# Patient Record
Sex: Male | Born: 1975 | Race: White | Hispanic: No | Marital: Married | Smoking: Never smoker
Health system: Southern US, Community
[De-identification: ages and names within clinical notes are randomized; demographics above are authoritative.]

## PROBLEM LIST (undated history)

## (undated) DIAGNOSIS — N200 Calculus of kidney: Secondary | ICD-10-CM

## (undated) DIAGNOSIS — Z9884 Bariatric surgery status: Secondary | ICD-10-CM

## (undated) DIAGNOSIS — Z9889 Other specified postprocedural states: Secondary | ICD-10-CM

---

## 2003-09-03 ENCOUNTER — Encounter: Admission: RE | Admit: 2003-09-03 | Discharge: 2003-12-02 | Payer: Self-pay | Admitting: Surgery

## 2003-09-07 ENCOUNTER — Ambulatory Visit (HOSPITAL_COMMUNITY): Admission: RE | Admit: 2003-09-07 | Discharge: 2003-09-07 | Payer: Self-pay | Admitting: Surgery

## 2003-09-11 ENCOUNTER — Ambulatory Visit (HOSPITAL_BASED_OUTPATIENT_CLINIC_OR_DEPARTMENT_OTHER): Admission: RE | Admit: 2003-09-11 | Discharge: 2003-09-11 | Payer: Self-pay | Admitting: Surgery

## 2003-11-10 ENCOUNTER — Inpatient Hospital Stay (HOSPITAL_COMMUNITY): Admission: RE | Admit: 2003-11-10 | Discharge: 2003-11-12 | Payer: Self-pay | Admitting: Surgery

## 2003-12-11 ENCOUNTER — Encounter: Admission: RE | Admit: 2003-12-11 | Discharge: 2004-03-10 | Payer: Self-pay | Admitting: Surgery

## 2004-05-13 ENCOUNTER — Encounter: Admission: RE | Admit: 2004-05-13 | Discharge: 2004-08-11 | Payer: Self-pay | Admitting: Surgery

## 2004-09-16 ENCOUNTER — Encounter: Admission: RE | Admit: 2004-09-16 | Discharge: 2004-12-15 | Payer: Self-pay | Admitting: Surgery

## 2004-11-29 ENCOUNTER — Encounter: Admission: RE | Admit: 2004-11-29 | Discharge: 2004-11-29 | Payer: Self-pay | Admitting: Surgery

## 2005-01-18 ENCOUNTER — Observation Stay (HOSPITAL_COMMUNITY): Admission: RE | Admit: 2005-01-18 | Discharge: 2005-01-19 | Payer: Self-pay | Admitting: Surgery

## 2005-12-27 IMAGING — CT CT ABDOMEN W/ CM
1 of 2 series · 15 of 32 positions shown, 19 images · IV contrast (GASTROGRAFIN & [ID] OMNI 300)
Comparison: none

CLINICAL DATA: Status post gastric bypass one year ago.  Now with supraumbilical pain.  Question hernia.
TECHNIQUE: Contiguous 5 mm axial images of the abdomen and pelvis were obtained after administration of oral and 100 cc of nonionic intravenous contrast. 
CT ABDOMEN WITH CONTRAST: 
No focal abnormality is seen in the liver or spleen.  Fluid is visible within the distal stomach and duodenum.  Pancreas, gallbladder, adrenal glands, and kidneys are unremarkable.  No free fluid or lymphadenopathy.
Approximately 5 cm cranial to the umbilicus, in the midline, is an 11 mm fascial defect which appears to only contain fat although there is fluid in the hernia sac.  No evidence for bowel obstruction.

[Series 2: a&p w/ · axial · 0.74mm/px · z∈[-434,-34]mm · 15 of 90 slices shown, 19 images]
[im 5/90  soft-tissue]
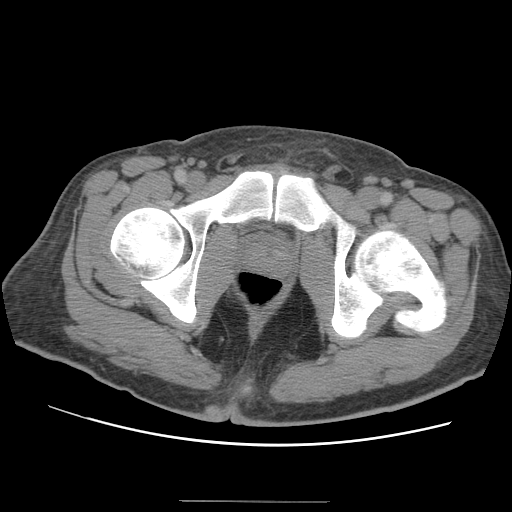
[im 5/90  bone]
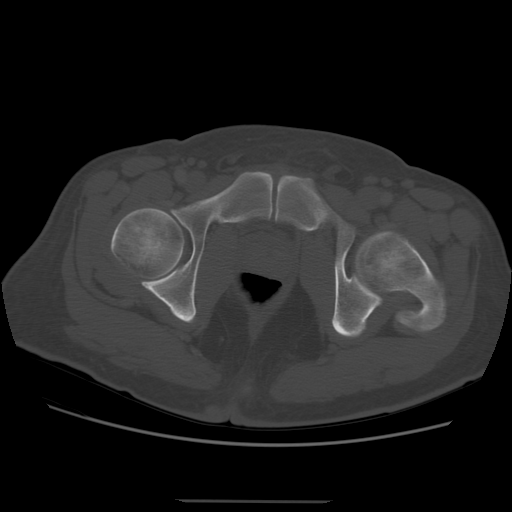
[im 13/90  soft-tissue]
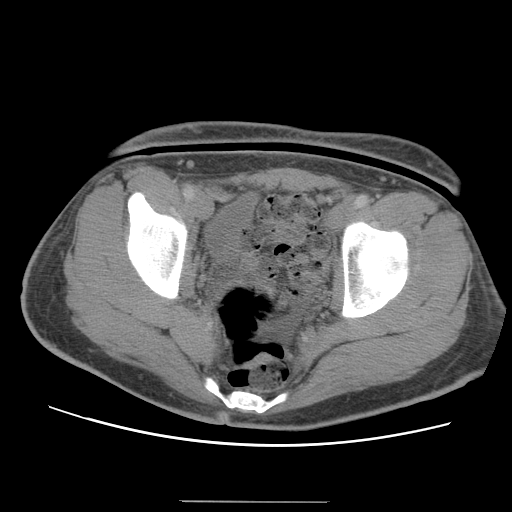
[im 21/90  soft-tissue]
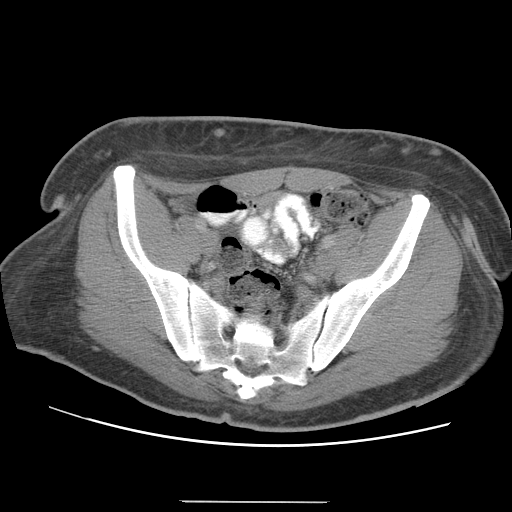
[im 25/90  soft-tissue]
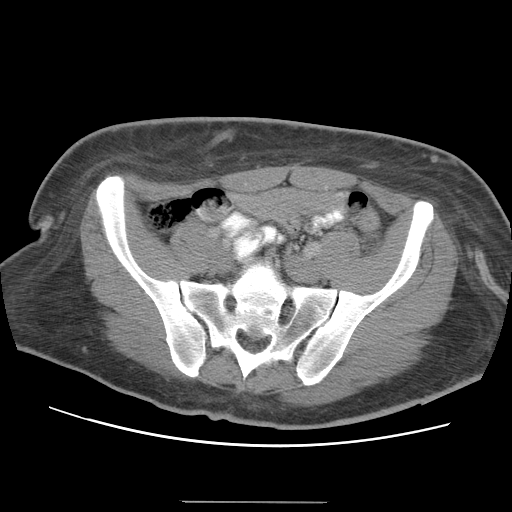
[im 33/90  soft-tissue]
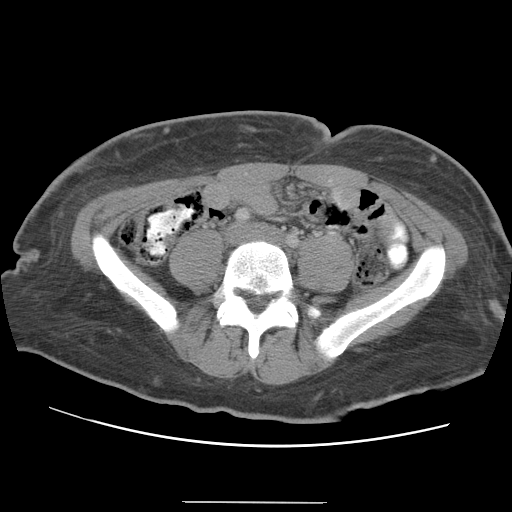
[im 37/90  soft-tissue]
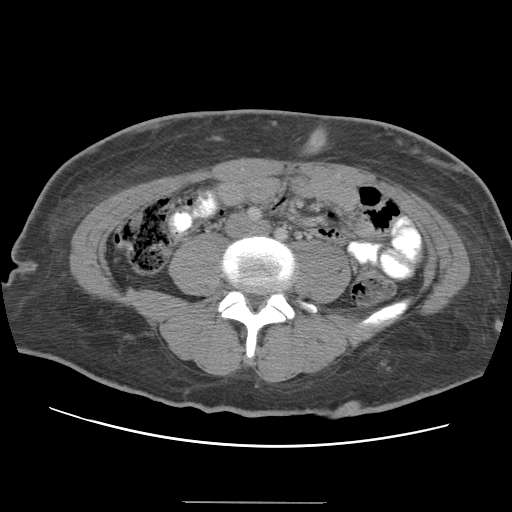
[im 45/90  soft-tissue]
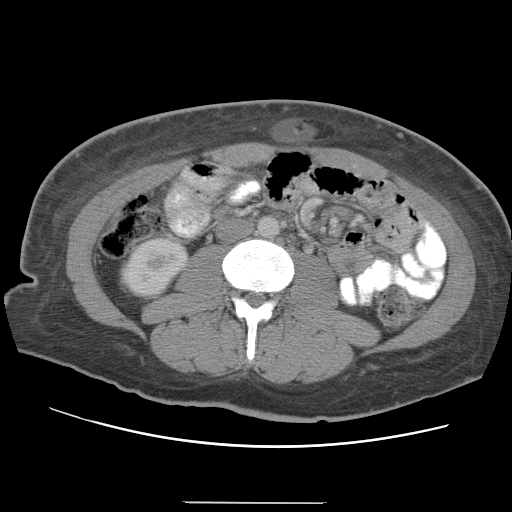
[im 53/90  soft-tissue]
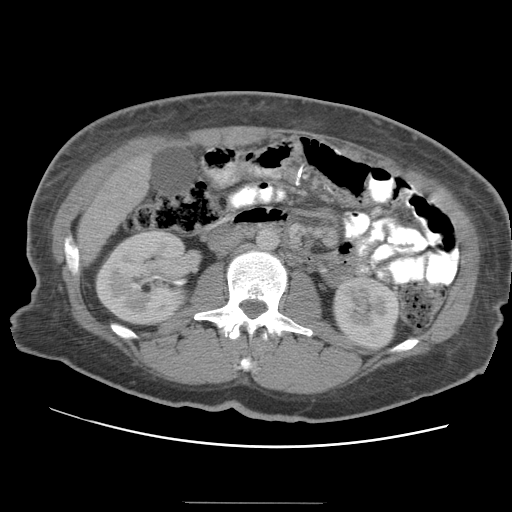
[im 57/90  soft-tissue]
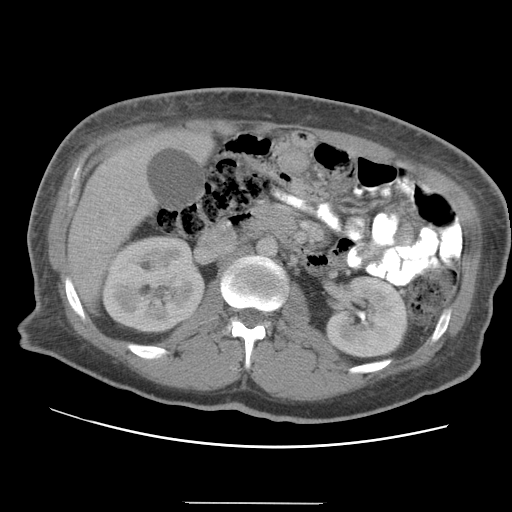
[im 57/90  bone]
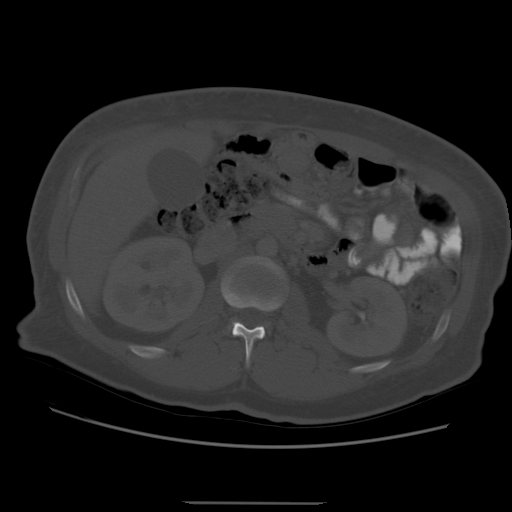
[im 65/90  soft-tissue]
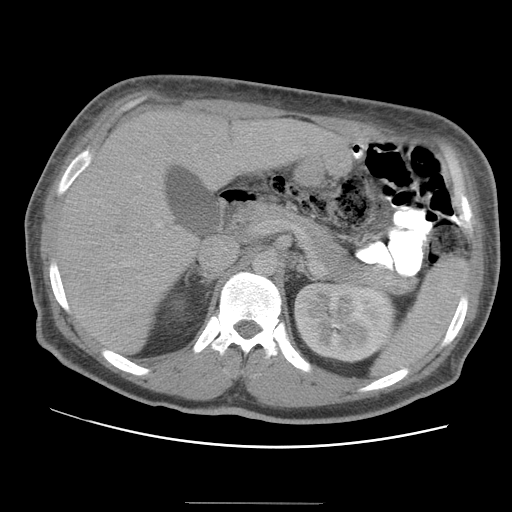
[im 69/90  soft-tissue]
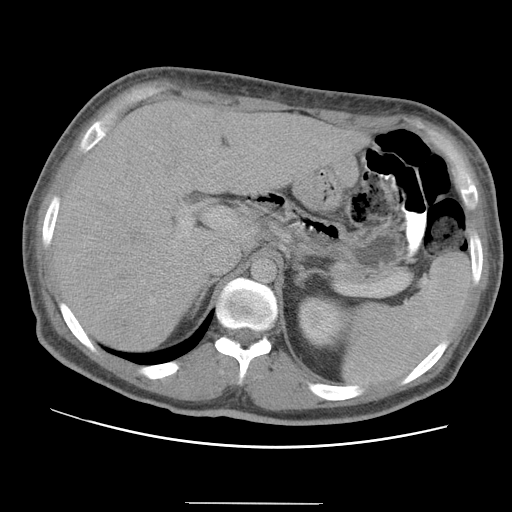
[im 73/90  lung]
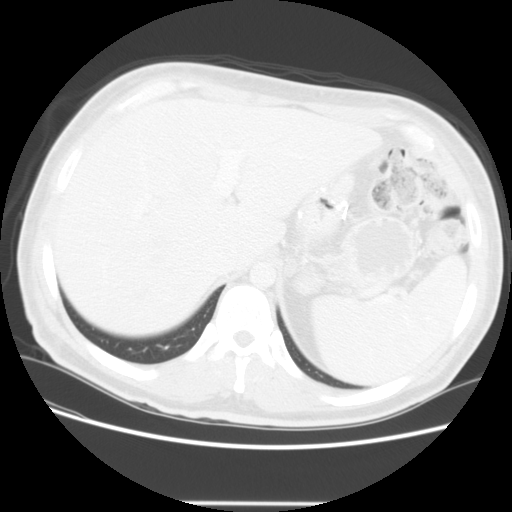
[im 77/90  soft-tissue]
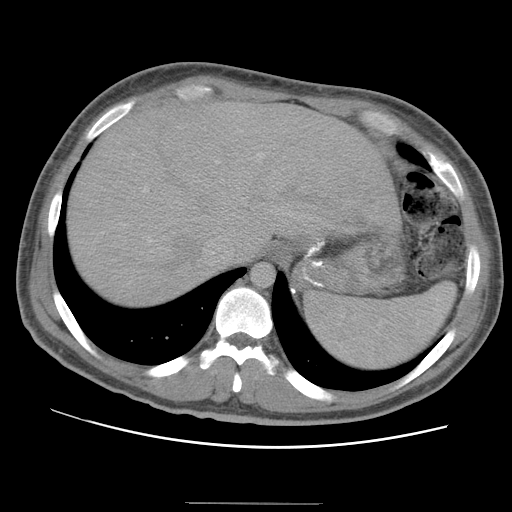
[im 77/90  lung]
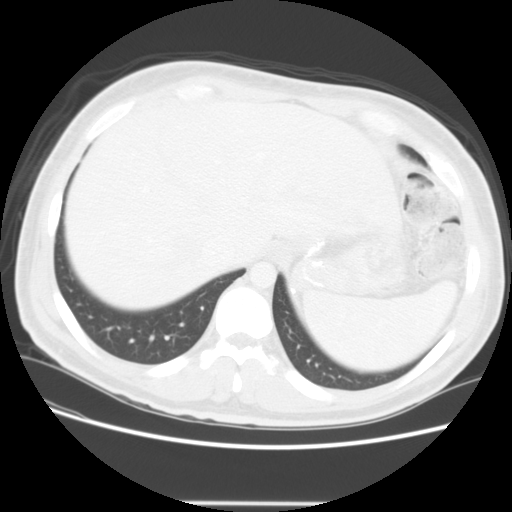
[im 81/90  lung]
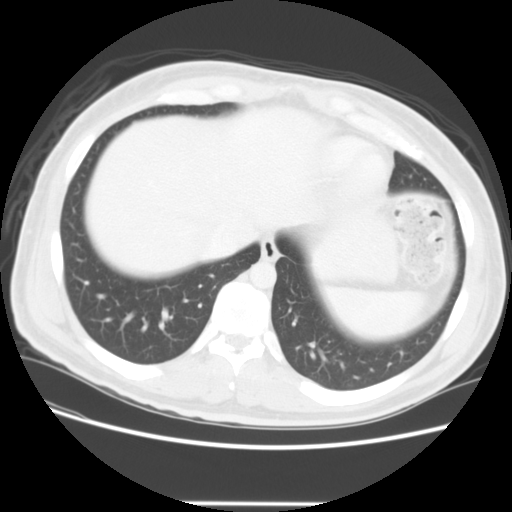
[im 85/90  soft-tissue]
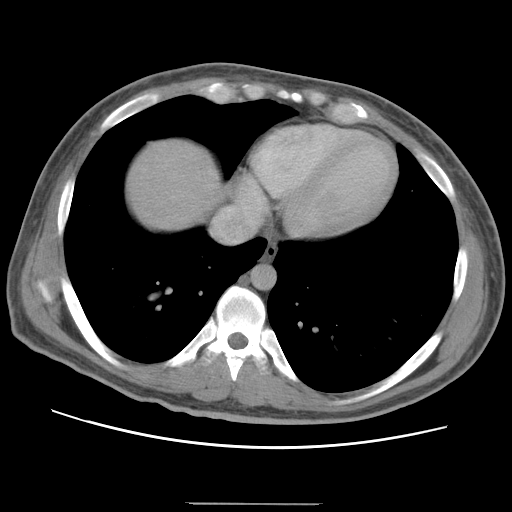
[im 85/90  lung]
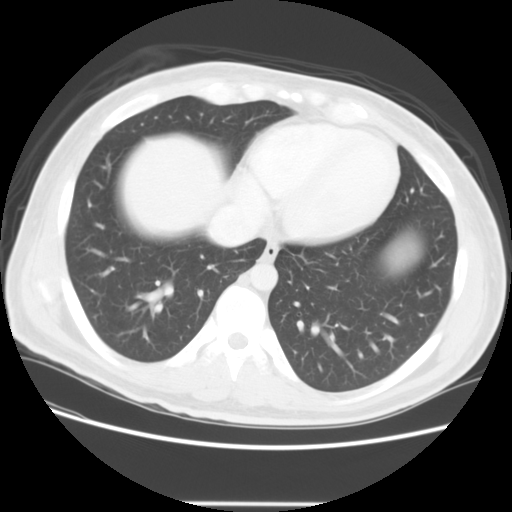

[15 of 32 positions shown; findings below may reference images not displayed]

IMPRESSION: 1.  Small midline fascial defect contains herniated fat and has associated fluid within the hernia sac.   Given the associated fluid, incarceration of the herniated fat is a concern.  
2.  Status post gastric bypass with fluid visible in the distal stomach. 

CT PELVIS WITH CONTRAST:
Trace amount of intraperitoneal free fluid is apparent.  No evidence for lymphadenopathy.  The terminal ileum has normal features.  The appendix is not well seen.
IMPRESSION: Small amount of free intraperitoneal fluid in the anatomic pelvis.

## 2007-09-05 DIAGNOSIS — Z9889 Other specified postprocedural states: Secondary | ICD-10-CM

## 2007-09-05 DIAGNOSIS — Z9884 Bariatric surgery status: Secondary | ICD-10-CM

## 2007-09-05 HISTORY — DX: Other specified postprocedural states: Z98.890

## 2007-09-05 HISTORY — DX: Bariatric surgery status: Z98.84

## 2018-02-08 DIAGNOSIS — S2239XA Fracture of one rib, unspecified side, initial encounter for closed fracture: Secondary | ICD-10-CM | POA: Diagnosis not present

## 2018-02-08 DIAGNOSIS — S22009A Unspecified fracture of unspecified thoracic vertebra, initial encounter for closed fracture: Secondary | ICD-10-CM | POA: Diagnosis not present

## 2018-02-09 ENCOUNTER — Other Ambulatory Visit: Payer: Self-pay

## 2018-02-09 ENCOUNTER — Observation Stay (HOSPITAL_COMMUNITY)
Admission: AD | Admit: 2018-02-09 | Discharge: 2018-02-11 | Disposition: A | Discharge status: Home / Self Care | Payer: No Typology Code available for payment source | Source: Other Acute Inpatient Hospital | Attending: Family Medicine | Admitting: Family Medicine

## 2018-02-09 ENCOUNTER — Encounter (HOSPITAL_COMMUNITY): Payer: Self-pay

## 2018-02-09 DIAGNOSIS — M5441 Lumbago with sciatica, right side: Secondary | ICD-10-CM | POA: Insufficient documentation

## 2018-02-09 DIAGNOSIS — R030 Elevated blood-pressure reading, without diagnosis of hypertension: Secondary | ICD-10-CM

## 2018-02-09 DIAGNOSIS — G8929 Other chronic pain: Secondary | ICD-10-CM | POA: Insufficient documentation

## 2018-02-09 DIAGNOSIS — K029 Dental caries, unspecified: Secondary | ICD-10-CM | POA: Diagnosis not present

## 2018-02-09 DIAGNOSIS — S2239XA Fracture of one rib, unspecified side, initial encounter for closed fracture: Secondary | ICD-10-CM | POA: Diagnosis not present

## 2018-02-09 DIAGNOSIS — Z9884 Bariatric surgery status: Secondary | ICD-10-CM | POA: Insufficient documentation

## 2018-02-09 DIAGNOSIS — S22029A Unspecified fracture of second thoracic vertebra, initial encounter for closed fracture: Secondary | ICD-10-CM | POA: Insufficient documentation

## 2018-02-09 DIAGNOSIS — S2232XA Fracture of one rib, left side, initial encounter for closed fracture: Secondary | ICD-10-CM | POA: Insufficient documentation

## 2018-02-09 DIAGNOSIS — Z87442 Personal history of urinary calculi: Secondary | ICD-10-CM | POA: Insufficient documentation

## 2018-02-09 DIAGNOSIS — S22019A Unspecified fracture of first thoracic vertebra, initial encounter for closed fracture: Principal | ICD-10-CM | POA: Insufficient documentation

## 2018-02-09 DIAGNOSIS — Z79899 Other long term (current) drug therapy: Secondary | ICD-10-CM | POA: Insufficient documentation

## 2018-02-09 DIAGNOSIS — S22009A Unspecified fracture of unspecified thoracic vertebra, initial encounter for closed fracture: Secondary | ICD-10-CM

## 2018-02-09 DIAGNOSIS — Z809 Family history of malignant neoplasm, unspecified: Secondary | ICD-10-CM | POA: Insufficient documentation

## 2018-02-09 DIAGNOSIS — M546 Pain in thoracic spine: Secondary | ICD-10-CM

## 2018-02-09 DIAGNOSIS — S2231XA Fracture of one rib, right side, initial encounter for closed fracture: Secondary | ICD-10-CM | POA: Insufficient documentation

## 2018-02-09 DIAGNOSIS — S0990XA Unspecified injury of head, initial encounter: Secondary | ICD-10-CM | POA: Insufficient documentation

## 2018-02-09 DIAGNOSIS — K439 Ventral hernia without obstruction or gangrene: Secondary | ICD-10-CM | POA: Insufficient documentation

## 2018-02-09 DIAGNOSIS — F1120 Opioid dependence, uncomplicated: Secondary | ICD-10-CM | POA: Diagnosis not present

## 2018-02-09 DIAGNOSIS — M549 Dorsalgia, unspecified: Secondary | ICD-10-CM | POA: Diagnosis present

## 2018-02-09 DIAGNOSIS — Z720 Tobacco use: Secondary | ICD-10-CM | POA: Insufficient documentation

## 2018-02-09 DIAGNOSIS — F1124 Opioid dependence with opioid-induced mood disorder: Secondary | ICD-10-CM

## 2018-02-09 HISTORY — DX: Other specified postprocedural states: Z98.890

## 2018-02-09 HISTORY — DX: Calculus of kidney: N20.0

## 2018-02-09 HISTORY — DX: Bariatric surgery status: Z98.84

## 2018-02-09 MED ORDER — POLYETHYLENE GLYCOL 3350 17 G PO PACK: 17.0000 g | PACK | Freq: Every day | ORAL | Status: DC | PRN

## 2018-02-09 MED ORDER — DOCUSATE SODIUM 100 MG PO CAPS
100.0000 mg | ORAL_CAPSULE | Freq: Two times a day (BID) | ORAL | Status: DC
  Administered 2018-02-10 – 2018-02-11 (×3): 100 mg via ORAL
  Filled 2018-02-09 (×3): qty 1

## 2018-02-09 MED ORDER — ACETAMINOPHEN 325 MG PO TABS: 650.0000 mg | ORAL_TABLET | Freq: Four times a day (QID) | ORAL | Status: DC | PRN

## 2018-02-09 MED ORDER — HYDROCODONE-ACETAMINOPHEN 5-325 MG PO TABS
1.0000 | ORAL_TABLET | ORAL | Status: DC | PRN
  Administered 2018-02-10 – 2018-02-11 (×8): 2 via ORAL
  Filled 2018-02-09 (×8): qty 2

## 2018-02-09 MED ORDER — SODIUM CHLORIDE 0.9% FLUSH
3.0000 mL | Freq: Two times a day (BID) | INTRAVENOUS | Status: DC
  Administered 2018-02-10 – 2018-02-11 (×3): 3 mL via INTRAVENOUS

## 2018-02-09 MED ORDER — SODIUM CHLORIDE 0.9 % IV SOLN
250.0000 mL | INTRAVENOUS | Status: DC | PRN
  Administered 2018-02-10: 250 mL via INTRAVENOUS

## 2018-02-09 MED ORDER — ONDANSETRON HCL 4 MG PO TABS: 4.0000 mg | ORAL_TABLET | Freq: Four times a day (QID) | ORAL | Status: DC | PRN

## 2018-02-09 MED ORDER — SENNA 8.6 MG PO TABS
1.0000 | ORAL_TABLET | Freq: Two times a day (BID) | ORAL | Status: DC
  Administered 2018-02-10 – 2018-02-11 (×2): 8.6 mg via ORAL
  Filled 2018-02-09 (×2): qty 1

## 2018-02-09 MED ORDER — ACETAMINOPHEN 650 MG RE SUPP: 650.0000 mg | Freq: Four times a day (QID) | RECTAL | Status: DC | PRN

## 2018-02-09 MED ORDER — SODIUM CHLORIDE 0.9% FLUSH: 3.0000 mL | INTRAVENOUS | Status: DC | PRN

## 2018-02-09 MED ORDER — ONDANSETRON HCL 4 MG/2ML IJ SOLN: 4.0000 mg | Freq: Four times a day (QID) | INTRAMUSCULAR | Status: DC | PRN
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
## 2018-02-10 ENCOUNTER — Encounter (HOSPITAL_COMMUNITY): Payer: Self-pay | Admitting: Internal Medicine

## 2018-02-10 ENCOUNTER — Ambulatory Visit (HOSPITAL_COMMUNITY): Payer: Self-pay

## 2018-02-10 ENCOUNTER — Observation Stay (HOSPITAL_COMMUNITY): Payer: No Typology Code available for payment source

## 2018-02-10 DIAGNOSIS — R030 Elevated blood-pressure reading, without diagnosis of hypertension: Secondary | ICD-10-CM | POA: Diagnosis present

## 2018-02-10 DIAGNOSIS — Z9889 Other specified postprocedural states: Secondary | ICD-10-CM | POA: Insufficient documentation

## 2018-02-10 DIAGNOSIS — F1120 Opioid dependence, uncomplicated: Secondary | ICD-10-CM | POA: Diagnosis present

## 2018-02-10 DIAGNOSIS — S22009A Unspecified fracture of unspecified thoracic vertebra, initial encounter for closed fracture: Secondary | ICD-10-CM | POA: Diagnosis present

## 2018-02-10 DIAGNOSIS — S22019A Unspecified fracture of first thoracic vertebra, initial encounter for closed fracture: Secondary | ICD-10-CM | POA: Diagnosis not present

## 2018-02-10 LAB — CBC
HCT: 43.5 % (ref 39.0–52.0)
Hemoglobin: 13.9 g/dL (ref 13.0–17.0)
MCH: 28.3 pg (ref 26.0–34.0)
MCHC: 32 g/dL (ref 30.0–36.0)
MCV: 88.6 fL (ref 78.0–100.0)
PLATELETS: 188 10*3/uL (ref 150–400)
RBC: 4.91 MIL/uL (ref 4.22–5.81)
RDW: 14.4 % (ref 11.5–15.5)
WBC: 6.9 10*3/uL (ref 4.0–10.5)

## 2018-02-10 LAB — COMPREHENSIVE METABOLIC PANEL
ALT: 30 U/L (ref 17–63)
AST: 37 U/L (ref 15–41)
Albumin: 3.6 g/dL (ref 3.5–5.0)
Alkaline Phosphatase: 73 U/L (ref 38–126)
Anion gap: 11 (ref 5–15)
BILIRUBIN TOTAL: 1 mg/dL (ref 0.3–1.2)
BUN: <5 mg/dL — ABNORMAL LOW (ref 6–20)
CO2: 27 mmol/L (ref 22–32)
CREATININE: 0.77 mg/dL (ref 0.61–1.24)
Calcium: 9 mg/dL (ref 8.9–10.3)
Chloride: 102 mmol/L (ref 101–111)
GFR calc Af Amer: >60 mL/min (ref >60)
Glucose, Bld: 145 mg/dL — ABNORMAL HIGH (ref 65–99)
Potassium: 3.9 mmol/L (ref 3.5–5.1)
Sodium: 140 mmol/L (ref 135–145)
TOTAL PROTEIN: 6.7 g/dL (ref 6.5–8.1)

## 2018-02-10 LAB — PHOSPHORUS: Phosphorus: 3.1 mg/dL (ref 2.5–4.6)

## 2018-02-10 LAB — TSH: TSH: 1.274 u[IU]/mL (ref 0.350–4.500)

## 2018-02-10 LAB — HIV ANTIBODY (ROUTINE TESTING W REFLEX): HIV Screen 4th Generation wRfx: NONREACTIVE

## 2018-02-10 LAB — MAGNESIUM: Magnesium: 1.9 mg/dL (ref 1.7–2.4)

## 2018-02-10 MED ORDER — GABAPENTIN 300 MG PO CAPS
600.0000 mg | ORAL_CAPSULE | Freq: Three times a day (TID) | ORAL | Status: DC
  Administered 2018-02-10 – 2018-02-11 (×4): 600 mg via ORAL
  Filled 2018-02-10 (×4): qty 2

## 2018-02-10 MED ORDER — OXYCODONE HCL 5 MG PO TABS
10.0000 mg | ORAL_TABLET | Freq: Four times a day (QID) | ORAL | Status: DC | PRN
  Administered 2018-02-10 – 2018-02-11 (×5): 10 mg via ORAL
  Filled 2018-02-10 (×5): qty 2

## 2018-02-10 MED ORDER — METHOCARBAMOL 1000 MG/10ML IJ SOLN
500.0000 mg | Freq: Four times a day (QID) | INTRAVENOUS | Status: DC | PRN
  Filled 2018-02-10: qty 5

## 2018-02-10 MED ORDER — MORPHINE SULFATE (PF) 2 MG/ML IV SOLN
0.5000 mg | INTRAVENOUS | Status: DC | PRN
  Administered 2018-02-10 (×3): 0.5 mg via INTRAVENOUS
  Filled 2018-02-10 (×3): qty 1

## 2018-02-10 MED ORDER — NAPROXEN SODIUM 275 MG PO TABS
275.0000 mg | ORAL_TABLET | Freq: Two times a day (BID) | ORAL | Status: DC
  Administered 2018-02-10 – 2018-02-11 (×3): 275 mg via ORAL
  Filled 2018-02-10 (×4): qty 1

## 2018-02-10 MED ORDER — METHOCARBAMOL 500 MG PO TABS
500.0000 mg | ORAL_TABLET | Freq: Four times a day (QID) | ORAL | Status: DC | PRN
  Administered 2018-02-11: 500 mg via ORAL
  Filled 2018-02-10: qty 1

## 2018-02-10 MED ORDER — HYDROCODONE-ACETAMINOPHEN 5-325 MG PO TABS: 1.0000 | ORAL_TABLET | Freq: Four times a day (QID) | ORAL | 0 refills | Status: AC | PRN

## 2018-02-10 MED ORDER — POLYETHYLENE GLYCOL 3350 17 G PO PACK: 17.0000 g | PACK | Freq: Every day | ORAL | 0 refills | Status: AC | PRN

## 2018-02-10 MED ORDER — BISACODYL 10 MG RE SUPP: 10.0000 mg | Freq: Every day | RECTAL | Status: DC | PRN

## 2018-02-10 MED ORDER — NICOTINE 21 MG/24HR TD PT24: 21.0000 mg | MEDICATED_PATCH | Freq: Every day | TRANSDERMAL | 0 refills | Status: AC

## 2018-02-10 MED ORDER — NICOTINE 21 MG/24HR TD PT24
21.0000 mg | MEDICATED_PATCH | Freq: Every day | TRANSDERMAL | Status: DC
  Administered 2018-02-10: 21 mg via TRANSDERMAL
  Filled 2018-02-10 (×2): qty 1
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
## 2018-02-10 NOTE — Progress Notes (Signed)
OT Cancellation Note  Patient Details Name: Stephen CorinDaniel D Hughes MRN: [id number] DOB: 20-Feb-1976   Cancelled Treatment:    Reason Eval/Treat Not Completed: Patient at procedure or test/ unavailable. Transport arriving to take pt to MRI. Will return as schedule allows for OT evaluation.  Of noted, per neurosurgery note, pt able to mobility and ordered TLSO for comfort.  Lance Huaracha M Luigi Stuckey Lorilee Cafarella MSOT, OTR/L Acute Rehab Pager: [phone] Office: [phone] 02/10/2018, 11:10 AM

## 2018-02-10 NOTE — Progress Notes (Signed)
6/9 @ 1145 Attempted to move pt from bed onto scan table for exam. Pt unable to be moved due to extreme pain. Told us to stop every time we tried to move him. Pt did not want to do scan at this time.

## 2018-02-10 NOTE — Progress Notes (Signed)
Patient refusing discharge at this time.  MD aware.

## 2018-02-10 NOTE — Progress Notes (Signed)
Orthopedic Tech Progress Note Patient Details:  Stephen Hughes March 28, 1976 [id number] Called [employer] for brace. Patient ID: Stephen Hughes, male   DOB: March 28, 1976, 42 y.o.   MRN: [id number]   Stephen Hughes, Stephen Hughes 02/10/2018, 3:23 PM
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
## 2018-02-10 NOTE — Plan of Care (Signed)
  Problem: Education: Goal: Knowledge of General Education information will improve Outcome: Progressing   

## 2018-02-10 NOTE — Progress Notes (Signed)
OT Cancellation    02/10/18 0800  OT Visit Information  Last OT Received On 02/10/18  Reason Eval/Treat Not Completed Patient not medically ready (Pt awaiting ortho consult. Will return as schedule allows. Thank you.)   Curlene Dolphinharis Mykel Sponaugle MSOT, OTR/L Acute Rehab Pager: [phone] Office: [phone]
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
## 2018-02-11 DIAGNOSIS — S22019A Unspecified fracture of first thoracic vertebra, initial encounter for closed fracture: Secondary | ICD-10-CM | POA: Diagnosis not present

## 2018-02-11 NOTE — Progress Notes (Signed)
Patient discharged yesterday, due to inability to obtain walker and TLSO brace on Sunday, he stayed until today.  Today, he is sitting in a chair, interactive.    BP (!) 145/105 (BP Location: Left Arm)   Pulse (!) 108   Temp 98.5 F (36.9 C) (Oral)   Resp 16   Ht 5\' 10"  (1.778 m)   Wt (!) 143.4 kg (316 lb 2.2 oz)   SpO2 94%   BMI 45.36 kg/m   Sitting in chair, interactive, movement appears to cause pain, tentative with movement.  Mentating well.  Reiterated plan for naproxen, Norco and chronic oxycodone.  We will try to arrange PT, if this is feasible.  Reiterated that this is a stable injury.  Family repeated concerns about risk of falls, I reassured that rib and transverse vert process fractures do not cause leg weakness, so these injuries would not cause that.  Encouraged mobility, follow up with Neurosurgery.

## 2018-02-11 NOTE — Care Management (Cosign Needed Addendum)
    Durable Medical Equipment  (From admission, onward)        Start     Ordered   02/11/18 1105  For home use only DME high strength lightweight manual wheelchair with seat cushion  Once    Comments:  Patient suffers from  T1 and T2 transverse process fractures  Possible T5, 6, T7 fractures Posterior 7th rib fracture Possible posterior 4th rib fx  which impairs their ability to perform daily activities like ambulating in the home.  A cane will not resolve  issue with performing activities of daily living. A wheelchair will allow patient to safely perform daily activities.  (T1 and T2 transverse process fractures  Possible T5, 6, T7 fractures Posterior 7th rib fracture Possible posterior 4th rib fx   Accessories: elevating leg rests (ELRs), wheel locks, extensions and anti-tippers.   02/11/18 1106   02/11/18 1020  For home use only DME Walker rolling  Once    Question:  Patient needs a walker to treat with the following condition  Answer:  Back pain   02/11/18 1020
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
## 2018-02-11 NOTE — Care Management Note (Addendum)
Case Management Note  Patient Details  Name: Stephen CorinDaniel D Zahm MRN: [id number] Date of Birth: Feb 28, 1976  Subjective/Objective:                    Action/Plan: Update , patient and family have spoke to Dr Maryfrances Bunnellanford now, agreeable to go home requesting wheelchair and walker . [facility] . Also patient sees Dr Vladimir Creeksavid Cook ( neuro ) , Dr Maryfrances Bunnellanford wants HHPT arranged through DR cook.    Spoke to patient at bedside and multiple family members at bedside. Patient and brother and sister upset stating they can be discharged today . Patient states he is not ambulatory and family can not take care of him.  Patient does not have PCP , tried to assist him in arranging one , ( need one to arrange HHPT) however , family became upset again.    Asking to speak to MD. Dr Maryfrances Bunnellanford aware.  Expected Discharge Date:  02/10/18               Expected Discharge Plan:     In-House Referral:     Discharge planning Services  CM Consult  Post Acute Care Choice:    Choice offered to:  Patient  DME Arranged:  Dan HumphreysWalker rolling DME Agency:  [facility]  [facility]     Status of Service:  In process, will continue to follow  If discussed at Long Length of Stay Meetings, dates discussed:    Additional Comments:  Kingsley PlanWile, Akelia Husted Marie, RN 02/11/2018, 10:24 AM

## 2018-02-11 NOTE — Care Management Note (Addendum)
Case Management Note  Patient Details  Name: Stephen Hughes MRN: [id number] Date of Birth: 08-21-1976  Subjective/Objective:                    Action/Plan: Added 3 in 1   Expected Discharge Date:  02/10/18               Expected Discharge Plan:     In-House Referral:  Financial Counselor  Discharge planning Services  CM Consult  Post Acute Care Choice:    Choice offered to:  Patient  DME Arranged:  Dan HumphreysWalker rolling, Wheelchair manual DME Agency:  [facility]  HH Arranged:  PT HH Agency:  [facility]  Status of Service:  Completed, signed off  If discussed at Long Length of Stay Meetings, dates discussed:    Additional Comments:  Kingsley PlanWile, Alaiza Yau Marie, RN 02/11/2018, 11:07 AM

## 2018-02-11 NOTE — Progress Notes (Signed)
Orthopedic Tech Progress Note Patient Details:  Stephen Hughes [birth year] [id number] Brace completed by bio-tech. Patient ID: Stephen Hughes, male   DOB: [birth year], 42 y.o.   MRN: [id number]   [name], [name] Craig 02/11/2018, 10:58 AM

## 2018-02-12 MED FILL — Hydromorphone HCl Inj 2 MG/ML: INTRAMUSCULAR | Qty: 1 | Status: AC
